# Patient Record
Sex: Female | Born: 1960 | Race: White | Hispanic: No | Marital: Single | Smoking: Never smoker
Health system: Southern US, Community
[De-identification: ages and names within clinical notes are randomized; demographics above are authoritative.]

---

## 2014-06-06 ENCOUNTER — Encounter (HOSPITAL_COMMUNITY): Payer: Self-pay | Admitting: Emergency Medicine

## 2014-06-06 ENCOUNTER — Emergency Department (HOSPITAL_COMMUNITY): Payer: No Typology Code available for payment source

## 2014-06-06 ENCOUNTER — Inpatient Hospital Stay (HOSPITAL_COMMUNITY)
Admission: EM | Admit: 2014-06-06 | Discharge: 2014-06-09 | DRG: 184 | Disposition: A | Discharge status: Home / Self Care | Payer: No Typology Code available for payment source | Attending: General Surgery | Admitting: General Surgery

## 2014-06-06 DIAGNOSIS — IMO0002 Reserved for concepts with insufficient information to code with codable children: Secondary | ICD-10-CM

## 2014-06-06 DIAGNOSIS — D62 Acute posthemorrhagic anemia: Secondary | ICD-10-CM | POA: Diagnosis present

## 2014-06-06 DIAGNOSIS — F3289 Other specified depressive episodes: Secondary | ICD-10-CM | POA: Diagnosis present

## 2014-06-06 DIAGNOSIS — S060X0A Concussion without loss of consciousness, initial encounter: Secondary | ICD-10-CM | POA: Diagnosis present

## 2014-06-06 DIAGNOSIS — M545 Low back pain, unspecified: Secondary | ICD-10-CM | POA: Diagnosis not present

## 2014-06-06 DIAGNOSIS — S22009A Unspecified fracture of unspecified thoracic vertebra, initial encounter for closed fracture: Secondary | ICD-10-CM | POA: Diagnosis present

## 2014-06-06 DIAGNOSIS — S2232XA Fracture of one rib, left side, initial encounter for closed fracture: Secondary | ICD-10-CM

## 2014-06-06 DIAGNOSIS — S060XAA Concussion with loss of consciousness status unknown, initial encounter: Secondary | ICD-10-CM | POA: Diagnosis present

## 2014-06-06 DIAGNOSIS — Y92410 Unspecified street and highway as the place of occurrence of the external cause: Secondary | ICD-10-CM

## 2014-06-06 DIAGNOSIS — S060X9A Concussion with loss of consciousness of unspecified duration, initial encounter: Secondary | ICD-10-CM | POA: Diagnosis present

## 2014-06-06 DIAGNOSIS — S2249XA Multiple fractures of ribs, unspecified side, initial encounter for closed fracture: Secondary | ICD-10-CM | POA: Diagnosis not present

## 2014-06-06 DIAGNOSIS — S2243XA Multiple fractures of ribs, bilateral, initial encounter for closed fracture: Secondary | ICD-10-CM | POA: Diagnosis present

## 2014-06-06 DIAGNOSIS — G43909 Migraine, unspecified, not intractable, without status migrainosus: Secondary | ICD-10-CM | POA: Diagnosis not present

## 2014-06-06 DIAGNOSIS — F329 Major depressive disorder, single episode, unspecified: Secondary | ICD-10-CM | POA: Diagnosis present

## 2014-06-06 MED ORDER — LORAZEPAM 2 MG/ML IJ SOLN
0.5000 mg | Freq: Once | INTRAMUSCULAR | Status: AC
  Administered 2014-06-06: 0.5 mg via INTRAVENOUS
  Filled 2014-06-06: qty 1

## 2014-06-06 MED ORDER — HYDROMORPHONE HCL PF 1 MG/ML IJ SOLN
0.5000 mg | Freq: Once | INTRAMUSCULAR | Status: AC
  Administered 2014-06-06: 0.5 mg via INTRAVENOUS
  Filled 2014-06-06: qty 1

## 2014-06-06 MED ORDER — IOHEXOL 300 MG/ML  SOLN: 100.0000 mL | Freq: Once | INTRAMUSCULAR | Status: AC | PRN

## 2014-06-06 NOTE — ED Notes (Signed)
Pt to ct 

## 2014-06-06 NOTE — ED Notes (Signed)
Pt hyperventilating at present

## 2014-06-06 NOTE — ED Notes (Signed)
Cautioned to slow repirations
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
## 2014-06-07 ENCOUNTER — Emergency Department (HOSPITAL_COMMUNITY): Payer: No Typology Code available for payment source

## 2014-06-07 DIAGNOSIS — M545 Low back pain, unspecified: Secondary | ICD-10-CM | POA: Diagnosis present

## 2014-06-07 DIAGNOSIS — D62 Acute posthemorrhagic anemia: Secondary | ICD-10-CM | POA: Diagnosis present

## 2014-06-07 DIAGNOSIS — G43909 Migraine, unspecified, not intractable, without status migrainosus: Secondary | ICD-10-CM | POA: Diagnosis not present

## 2014-06-07 DIAGNOSIS — S060X0A Concussion without loss of consciousness, initial encounter: Secondary | ICD-10-CM | POA: Diagnosis present

## 2014-06-07 DIAGNOSIS — F329 Major depressive disorder, single episode, unspecified: Secondary | ICD-10-CM | POA: Diagnosis present

## 2014-06-07 DIAGNOSIS — Y92410 Unspecified street and highway as the place of occurrence of the external cause: Secondary | ICD-10-CM | POA: Diagnosis not present

## 2014-06-07 DIAGNOSIS — F3289 Other specified depressive episodes: Secondary | ICD-10-CM | POA: Diagnosis present

## 2014-06-07 DIAGNOSIS — S22009A Unspecified fracture of unspecified thoracic vertebra, initial encounter for closed fracture: Secondary | ICD-10-CM | POA: Diagnosis present

## 2014-06-07 DIAGNOSIS — S2249XA Multiple fractures of ribs, unspecified side, initial encounter for closed fracture: Secondary | ICD-10-CM | POA: Diagnosis present

## 2014-06-07 LAB — I-STAT CHEM 8, ED
BUN: 21 mg/dL (ref 6–23)
CALCIUM ION: 1.15 mmol/L (ref 1.12–1.23)
Chloride: 108 mEq/L (ref 96–112)
Creatinine, Ser: 1.1 mg/dL (ref 0.50–1.10)
Glucose, Bld: 133 mg/dL — ABNORMAL HIGH (ref 70–99)
HEMATOCRIT: 41 % (ref 36.0–46.0)
HEMOGLOBIN: 13.9 g/dL (ref 12.0–15.0)
POTASSIUM: 3.9 meq/L (ref 3.7–5.3)
Sodium: 142 mEq/L (ref 137–147)
TCO2: 24 mmol/L (ref 0–100)

## 2014-06-07 LAB — CBC
HCT: 37.6 % (ref 36.0–46.0)
Hemoglobin: 12 g/dL (ref 12.0–15.0)
MCH: 30.6 pg (ref 26.0–34.0)
MCHC: 31.9 g/dL (ref 30.0–36.0)
MCV: 95.9 fL (ref 78.0–100.0)
Platelets: 248 10*3/uL (ref 150–400)
RBC: 3.92 MIL/uL (ref 3.87–5.11)
RDW: 12.9 % (ref 11.5–15.5)
WBC: 15.4 10*3/uL — AB (ref 4.0–10.5)

## 2014-06-07 LAB — URINALYSIS, ROUTINE W REFLEX MICROSCOPIC
Bilirubin Urine: NEGATIVE
Glucose, UA: NEGATIVE mg/dL
Ketones, ur: 15 mg/dL — AB
Nitrite: NEGATIVE
PROTEIN: NEGATIVE mg/dL
Specific Gravity, Urine: 1.026 (ref 1.005–1.030)
UROBILINOGEN UA: 0.2 mg/dL (ref 0.0–1.0)
pH: 5.5 (ref 5.0–8.0)

## 2014-06-07 LAB — URINE MICROSCOPIC-ADD ON

## 2014-06-07 LAB — ETHANOL: Alcohol, Ethyl (B): <11 mg/dL (ref 0–11)

## 2014-06-07 MED ORDER — ONDANSETRON HCL 4 MG/2ML IJ SOLN
4.0000 mg | Freq: Four times a day (QID) | INTRAMUSCULAR | Status: DC | PRN
  Administered 2014-06-08: 4 mg via INTRAVENOUS
  Filled 2014-06-07 (×2): qty 2

## 2014-06-07 MED ORDER — PANTOPRAZOLE SODIUM 40 MG IV SOLR
40.0000 mg | Freq: Every day | INTRAVENOUS | Status: DC
  Filled 2014-06-07: qty 40

## 2014-06-07 MED ORDER — DOCUSATE SODIUM 100 MG PO CAPS
100.0000 mg | ORAL_CAPSULE | Freq: Two times a day (BID) | ORAL | Status: DC
  Administered 2014-06-07 – 2014-06-09 (×5): 100 mg via ORAL
  Filled 2014-06-07 (×5): qty 1

## 2014-06-07 MED ORDER — FENTANYL CITRATE 0.05 MG/ML IJ SOLN
100.0000 ug | Freq: Once | INTRAMUSCULAR | Status: AC
  Administered 2014-06-07: 100 ug via INTRAVENOUS
  Filled 2014-06-07: qty 2

## 2014-06-07 MED ORDER — ONDANSETRON HCL 4 MG/2ML IJ SOLN
4.0000 mg | Freq: Once | INTRAMUSCULAR | Status: AC
  Administered 2014-06-07: 4 mg via INTRAVENOUS
  Filled 2014-06-07: qty 2

## 2014-06-07 MED ORDER — ENOXAPARIN SODIUM 40 MG/0.4ML ~~LOC~~ SOLN
40.0000 mg | SUBCUTANEOUS | Status: DC
  Administered 2014-06-07 – 2014-06-09 (×3): 40 mg via SUBCUTANEOUS
  Filled 2014-06-07 (×4): qty 0.4

## 2014-06-07 MED ORDER — PANTOPRAZOLE SODIUM 40 MG PO TBEC
40.0000 mg | DELAYED_RELEASE_TABLET | Freq: Every day | ORAL | Status: DC
  Administered 2014-06-07: 40 mg via ORAL
  Filled 2014-06-07: qty 1

## 2014-06-07 MED ORDER — MORPHINE SULFATE 2 MG/ML IJ SOLN
1.0000 mg | INTRAMUSCULAR | Status: DC | PRN
  Administered 2014-06-07: 2 mg via INTRAVENOUS
  Administered 2014-06-07: 4 mg via INTRAVENOUS
  Administered 2014-06-07 (×2): 2 mg via INTRAVENOUS
  Administered 2014-06-07: 4 mg via INTRAVENOUS
  Administered 2014-06-08: 2 mg via INTRAVENOUS
  Filled 2014-06-07 (×5): qty 1
  Filled 2014-06-07: qty 2
  Filled 2014-06-07: qty 1

## 2014-06-07 MED ORDER — ACETAMINOPHEN 325 MG PO TABS
650.0000 mg | ORAL_TABLET | Freq: Four times a day (QID) | ORAL | Status: DC | PRN
  Administered 2014-06-07 – 2014-06-09 (×3): 650 mg via ORAL
  Filled 2014-06-07 (×3): qty 2

## 2014-06-07 MED ORDER — IOHEXOL 300 MG/ML  SOLN
100.0000 mL | Freq: Once | INTRAMUSCULAR | Status: AC | PRN
  Administered 2014-06-07: 100 mL via INTRAVENOUS

## 2014-06-07 MED ORDER — OXYCODONE HCL 5 MG PO TABS
2.5000 mg | ORAL_TABLET | ORAL | Status: DC | PRN
Start: 2014-06-07 — End: 2014-06-08

## 2014-06-07 MED ORDER — OXYCODONE HCL 5 MG PO TABS: 5.0000 mg | ORAL_TABLET | ORAL | Status: DC | PRN

## 2014-06-07 MED ORDER — METHOCARBAMOL 1000 MG/10ML IJ SOLN
500.0000 mg | Freq: Three times a day (TID) | INTRAVENOUS | Status: DC
  Administered 2014-06-07 – 2014-06-08 (×4): 500 mg via INTRAVENOUS
  Filled 2014-06-07 (×6): qty 5

## 2014-06-07 MED ORDER — OXYCODONE HCL 5 MG PO TABS: 10.0000 mg | ORAL_TABLET | ORAL | Status: DC | PRN

## 2014-06-07 MED ORDER — FENTANYL CITRATE 0.05 MG/ML IJ SOLN: 50.0000 ug | Freq: Once | INTRAMUSCULAR | Status: DC

## 2014-06-07 MED ORDER — ONDANSETRON HCL 4 MG PO TABS: 4.0000 mg | ORAL_TABLET | Freq: Four times a day (QID) | ORAL | Status: DC | PRN

## 2014-06-07 MED ORDER — POTASSIUM CHLORIDE IN NACL 20-0.9 MEQ/L-% IV SOLN
INTRAVENOUS | Status: DC
  Administered 2014-06-07 – 2014-06-08 (×2): via INTRAVENOUS
  Filled 2014-06-07 (×3): qty 1000

## 2014-06-07 NOTE — ED Notes (Signed)
c-t has been called because the pt is  Sleeping and she will probably hold still for a c-t now
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
## 2014-06-07 NOTE — ED Notes (Signed)
Report attempted   To 6n  unable

## 2014-06-07 NOTE — ED Notes (Signed)
Pt sleeping   She appears comfortable 

## 2014-06-07 NOTE — Progress Notes (Signed)
UR completed 

## 2014-06-07 NOTE — ED Notes (Signed)
The pts color is better and the pt is more alert than ear;ier even before she received the pain med.  She is still attempting to contact her sister whos number she cannoit renenber

## 2014-06-07 NOTE — ED Notes (Signed)
Report called to 6n rn
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
## 2014-06-07 NOTE — ED Notes (Signed)
Per CT staff, pt would not sit still long enough to complete abdominal CT, was able to complete head and c-spine, will attempt to rescan when pt is able to sit still

## 2014-06-07 NOTE — ED Notes (Signed)
Still has pain in her back especially when she moves
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
## 2014-06-07 NOTE — ED Notes (Signed)
Nasal 02 at 2 
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
## 2014-06-08 ENCOUNTER — Inpatient Hospital Stay (HOSPITAL_COMMUNITY): Payer: No Typology Code available for payment source

## 2014-06-08 DIAGNOSIS — S060X9A Concussion with loss of consciousness of unspecified duration, initial encounter: Secondary | ICD-10-CM | POA: Diagnosis present

## 2014-06-08 DIAGNOSIS — F32A Depression, unspecified: Secondary | ICD-10-CM | POA: Insufficient documentation

## 2014-06-08 DIAGNOSIS — D62 Acute posthemorrhagic anemia: Secondary | ICD-10-CM | POA: Diagnosis not present

## 2014-06-08 DIAGNOSIS — S060XAA Concussion with loss of consciousness status unknown, initial encounter: Secondary | ICD-10-CM | POA: Diagnosis present

## 2014-06-08 DIAGNOSIS — S22009A Unspecified fracture of unspecified thoracic vertebra, initial encounter for closed fracture: Secondary | ICD-10-CM | POA: Diagnosis present

## 2014-06-08 DIAGNOSIS — S2243XA Multiple fractures of ribs, bilateral, initial encounter for closed fracture: Secondary | ICD-10-CM | POA: Diagnosis present

## 2014-06-08 DIAGNOSIS — F329 Major depressive disorder, single episode, unspecified: Secondary | ICD-10-CM | POA: Insufficient documentation

## 2014-06-08 LAB — BASIC METABOLIC PANEL
Anion gap: 10 (ref 5–15)
BUN: 11 mg/dL (ref 6–23)
CHLORIDE: 108 meq/L (ref 96–112)
CO2: 23 mEq/L (ref 19–32)
Calcium: 8.5 mg/dL (ref 8.4–10.5)
Creatinine, Ser: 0.85 mg/dL (ref 0.50–1.10)
GFR, EST NON AFRICAN AMERICAN: 78 mL/min — AB (ref >90)
Glucose, Bld: 107 mg/dL — ABNORMAL HIGH (ref 70–99)
POTASSIUM: 4.8 meq/L (ref 3.7–5.3)
SODIUM: 141 meq/L (ref 137–147)

## 2014-06-08 LAB — CBC
HCT: 30.9 % — ABNORMAL LOW (ref 36.0–46.0)
HEMOGLOBIN: 9.9 g/dL — AB (ref 12.0–15.0)
MCH: 31.4 pg (ref 26.0–34.0)
MCHC: 32 g/dL (ref 30.0–36.0)
MCV: 98.1 fL (ref 78.0–100.0)
Platelets: 191 10*3/uL (ref 150–400)
RBC: 3.15 MIL/uL — ABNORMAL LOW (ref 3.87–5.11)
RDW: 13.3 % (ref 11.5–15.5)
WBC: 8.7 10*3/uL (ref 4.0–10.5)

## 2014-06-08 MED ORDER — OXYCODONE HCL 5 MG PO TABS
5.0000 mg | ORAL_TABLET | ORAL | Status: DC | PRN
  Administered 2014-06-08 (×2): 10 mg via ORAL
  Administered 2014-06-09: 15 mg via ORAL
  Filled 2014-06-08: qty 3
  Filled 2014-06-08 (×2): qty 2

## 2014-06-08 MED ORDER — BUPROPION HCL ER (SR) 150 MG PO TB12
150.0000 mg | ORAL_TABLET | Freq: Two times a day (BID) | ORAL | Status: DC
Start: 2014-06-08 — End: 2014-06-09
  Administered 2014-06-08 – 2014-06-09 (×3): 150 mg via ORAL
  Filled 2014-06-08 (×4): qty 1

## 2014-06-08 MED ORDER — MORPHINE SULFATE 2 MG/ML IJ SOLN
2.0000 mg | INTRAMUSCULAR | Status: DC | PRN
  Administered 2014-06-08: 2 mg via INTRAVENOUS
  Filled 2014-06-08: qty 1

## 2014-06-08 MED ORDER — POLYETHYLENE GLYCOL 3350 17 G PO PACK
17.0000 g | PACK | Freq: Every day | ORAL | Status: DC
Start: 2014-06-08 — End: 2014-06-09
  Administered 2014-06-08 – 2014-06-09 (×2): 17 g via ORAL
  Filled 2014-06-08 (×2): qty 1

## 2014-06-08 MED ORDER — METHOCARBAMOL 500 MG PO TABS: 1000.0000 mg | ORAL_TABLET | Freq: Four times a day (QID) | ORAL | Status: DC | PRN

## 2014-06-08 MED ORDER — SPIRONOLACTONE 25 MG PO TABS
25.0000 mg | ORAL_TABLET | Freq: Every day | ORAL | Status: DC
  Administered 2014-06-08 – 2014-06-09 (×2): 25 mg via ORAL
  Filled 2014-06-08 (×2): qty 1

## 2014-06-08 MED ORDER — NAPROXEN 250 MG PO TABS
500.0000 mg | ORAL_TABLET | Freq: Two times a day (BID) | ORAL | Status: DC
Start: 2014-06-08 — End: 2014-06-09
  Administered 2014-06-08 – 2014-06-09 (×3): 500 mg via ORAL
  Filled 2014-06-08 (×5): qty 2

## 2014-06-08 NOTE — Progress Notes (Signed)
Patient doing extremely well and probably can go home later today.  IS up to 1500cc now.  This patient has been seen and I agree with the findings and treatment plan.  Marta Lamas. Gae Bon, MD, FACS [phone] (pager) [phone] (direct pager) Trauma Surgeon

## 2014-06-08 NOTE — ED Provider Notes (Signed)
Medical screening examination/treatment/procedure(s) were performed by non-physician practitioner and as supervising physician I was immediately available for consultation/collaboration.   EKG Interpretation None        Suliman Termini W [place] Dani, MD 06/08/14 0722 
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
## 2014-06-09 DIAGNOSIS — G43909 Migraine, unspecified, not intractable, without status migrainosus: Secondary | ICD-10-CM | POA: Insufficient documentation

## 2014-06-09 LAB — CBC
HCT: 30.5 % — ABNORMAL LOW (ref 36.0–46.0)
Hemoglobin: 10 g/dL — ABNORMAL LOW (ref 12.0–15.0)
MCH: 32.3 pg (ref 26.0–34.0)
MCHC: 32.8 g/dL (ref 30.0–36.0)
MCV: 98.4 fL (ref 78.0–100.0)
Platelets: 179 10*3/uL (ref 150–400)
RBC: 3.1 MIL/uL — ABNORMAL LOW (ref 3.87–5.11)
RDW: 13.1 % (ref 11.5–15.5)
WBC: 8 10*3/uL (ref 4.0–10.5)

## 2014-06-09 MED ORDER — NAPROXEN 500 MG PO TABS: 500.0000 mg | ORAL_TABLET | Freq: Two times a day (BID) | ORAL | Status: AC

## 2014-06-09 MED ORDER — OXYCODONE-ACETAMINOPHEN 5-325 MG PO TABS: 1.0000 | ORAL_TABLET | ORAL | Status: AC | PRN

## 2014-06-09 MED ORDER — ELETRIPTAN HYDROBROMIDE 40 MG PO TABS
40.0000 mg | ORAL_TABLET | Freq: Once | ORAL | Status: AC
  Administered 2014-06-09: 40 mg via ORAL
  Filled 2014-06-09 (×2): qty 1

## 2014-06-09 NOTE — Progress Notes (Signed)
Patient given discharge paperwork. Prescriptions given to patient. Patient's questions answered. Patient is ready for discharge.
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
## 2014-06-09 NOTE — Discharge Instructions (Signed)
No driving while taking oxycodone. ° °Increase activity as pain allows. °

## 2014-06-19 ENCOUNTER — Telehealth (INDEPENDENT_AMBULATORY_CARE_PROVIDER_SITE_OTHER): Payer: Self-pay | Admitting: General Surgery
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
## 2015-07-03 IMAGING — CT CT ABD-PELV W/ CM
1 of 5 series · 3 of 36 positions shown, 4 images · IV contrast (Iodine)
Comparison: None.

EXAM:
CT CHEST, ABDOMEN, AND PELVIS WITH CONTRAST
TECHNIQUE: Multidetector CT imaging of the chest, abdomen and pelvis was
performed following the standard protocol during bolus
administration of intravenous contrast.

CONTRAST:  100mL OMNIPAQUE IOHEXOL 300 MG/ML  SOLN

[Series 204: coronals · coronal · 0.50mm/px · 3 of 107 slices shown, 4 images]
[im 22/107  mediastinal]
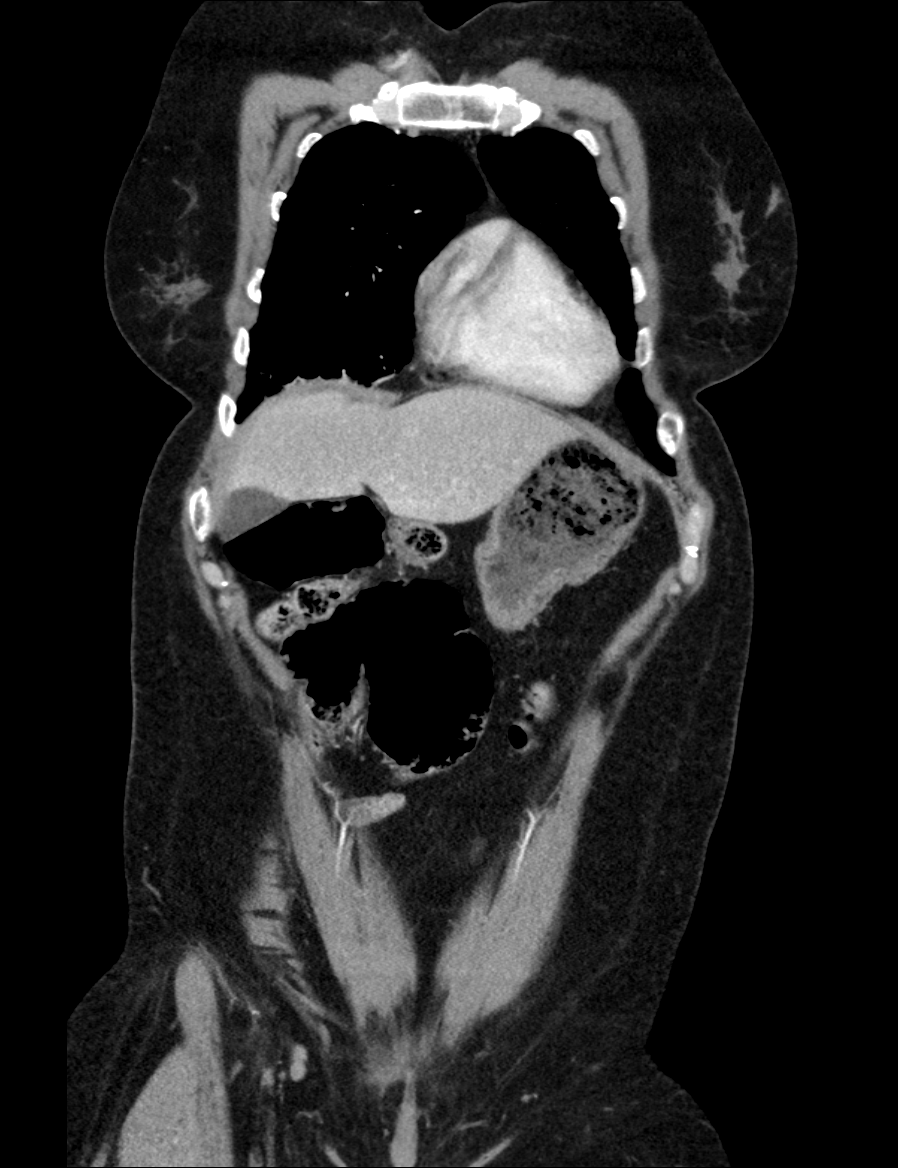
[im 22/107  lung]
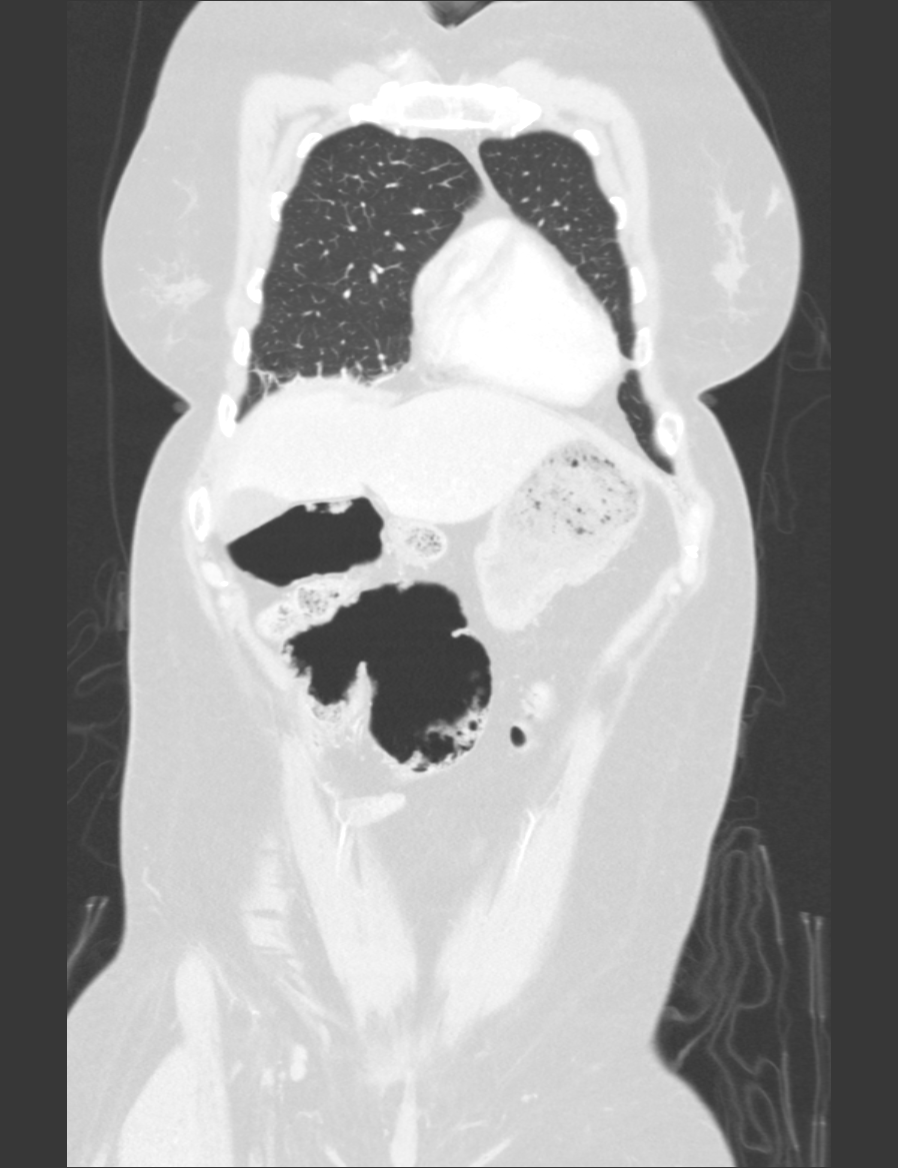
[im 64/107  lung]
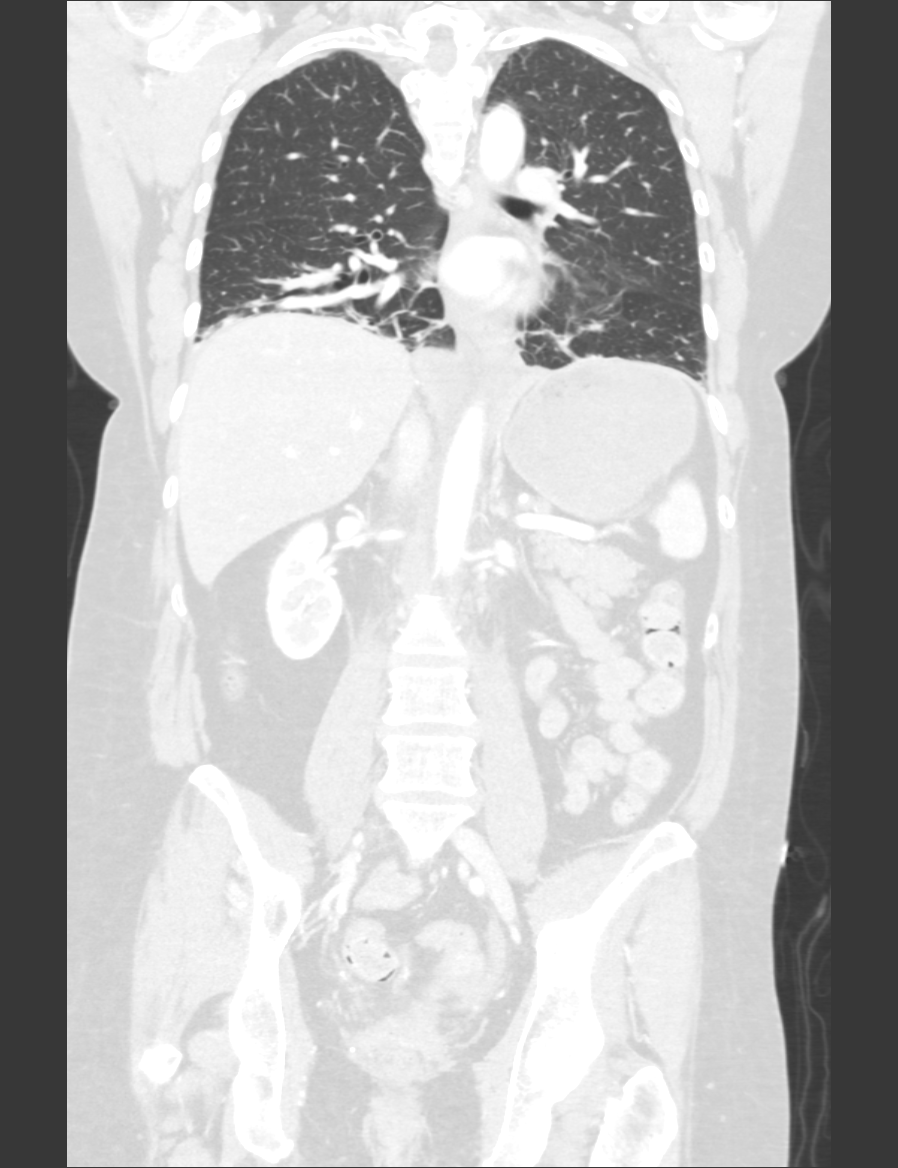
[im 85/107  lung]
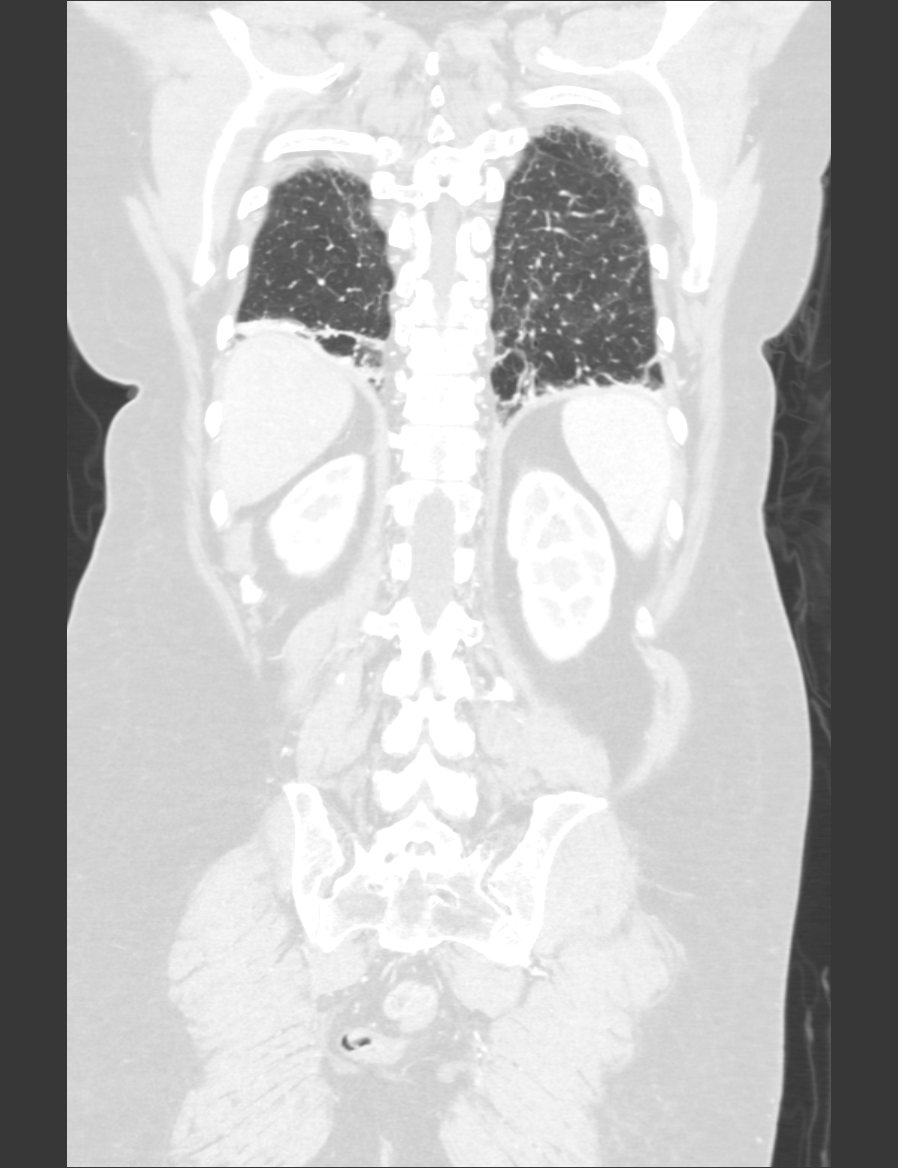

[3 of 36 positions shown; findings below may reference images not displayed]

FINDINGS: CT CHEST FINDINGS

Partially visualized thyroid is within normal limits.

Intrathoracic aorta is of normal caliber and appearance without
evidence of acute traumatic injury. Cardiac pulsation artifact
present at the aortic root. No mediastinal hematoma. Great vessels
are intact.

Heart size is normal. No pericardial effusion. Pulmonary arteries
grossly unremarkable.

The lungs are clear without pneumothorax or pulmonary contusion. No
pleural effusion or hemothorax. Mild mild atelectasis seen
dependently within the lower lobes as well is a mild lingula.

Previously identified right first rib and T1 right transverse
process fractures not definitely seen.

There are acute nondisplaced fractures of the right first through
seventh ribs laterally. On the left, there is an acute nondisplaced
fracture of the left lateral seventh and eighth ribs. Eleven pairs
are ribs are seen bilaterally.

CT ABDOMEN AND PELVIS FINDINGS

The liver demonstrates a normal contrast enhanced appearance without
evidence of acute traumatic injury. Gallbladder within normal
limits. No biliary dilatation. Spleen is intact. No perisplenic
hematoma. Focal hypodensity within the inferior aspect of the spleen
on arterial phase imaging noted (series 201, image 52). This is not
seen on delayed phase imaging, and likely represents local
heterogeneous perfusion.

Adrenal glands, pancreas, and kidneys are within normal limits.
Subcentimeter hypodense lesion within the inferior pole the right
kidney is too small the characterize, but may represent a small
cyst.

Stomach is unremarkable. Mild thickening of the diaphragmatic crus
noted near the GE junction. No bowel obstruction or acute bowel
injury. No acute inflammatory changes seen about the bowels.
Appendix is normal.

Bladder within normal limits.  Uterus and ovaries are unremarkable.

No free air or fluid identified within the abdomen and pelvis. No
mesenteric or retroperitoneal hematoma.

Normal intravascular enhancement seen throughout the intra-abdominal
aorta and its branch vessels. No contrast extravasation.

No adenopathy.

No acute fracture within the abdomen and pelvis.
IMPRESSION: 1. Acute nondisplaced fractures of the right first through seventh
ribs, and left lateral seventh and eighth ribs. No associated
pneumothorax.
2. No other acute traumatic injury within the chest, abdomen, and
pelvis.
